# Patient Record
Sex: Male | Born: 1995 | Race: Black or African American | Hispanic: No | Marital: Single | Smoking: Current every day smoker
Health system: Southern US, Community
[De-identification: ages and names within clinical notes are randomized; demographics above are authoritative.]

---

## 2020-10-20 ENCOUNTER — Encounter (HOSPITAL_COMMUNITY): Payer: Self-pay

## 2020-10-20 ENCOUNTER — Other Ambulatory Visit: Payer: Self-pay

## 2020-10-20 ENCOUNTER — Emergency Department (HOSPITAL_COMMUNITY): Payer: Self-pay

## 2020-10-20 ENCOUNTER — Emergency Department (HOSPITAL_COMMUNITY)
Admission: EM | Admit: 2020-10-20 | Discharge: 2020-10-20 | Disposition: A | Discharge status: Home / Self Care | Payer: Self-pay | Attending: Emergency Medicine | Admitting: Emergency Medicine

## 2020-10-20 DIAGNOSIS — Z23 Encounter for immunization: Secondary | ICD-10-CM | POA: Insufficient documentation

## 2020-10-20 DIAGNOSIS — S51811A Laceration without foreign body of right forearm, initial encounter: Secondary | ICD-10-CM | POA: Insufficient documentation

## 2020-10-20 DIAGNOSIS — F17210 Nicotine dependence, cigarettes, uncomplicated: Secondary | ICD-10-CM | POA: Insufficient documentation

## 2020-10-20 DIAGNOSIS — S41111A Laceration without foreign body of right upper arm, initial encounter: Secondary | ICD-10-CM

## 2020-10-20 MED ORDER — CEPHALEXIN 500 MG PO CAPS: 500.0000 mg | ORAL_CAPSULE | Freq: Three times a day (TID) | ORAL | 0 refills | Status: AC

## 2020-10-20 MED ORDER — CEPHALEXIN 500 MG PO CAPS
500.0000 mg | ORAL_CAPSULE | Freq: Once | ORAL | Status: AC
  Administered 2020-10-20: 500 mg via ORAL
  Filled 2020-10-20: qty 1

## 2020-10-20 MED ORDER — TETANUS-DIPHTH-ACELL PERTUSSIS 5-2.5-18.5 LF-MCG/0.5 IM SUSY
0.5000 mL | PREFILLED_SYRINGE | Freq: Once | INTRAMUSCULAR | Status: AC
  Administered 2020-10-20: 0.5 mL via INTRAMUSCULAR
  Filled 2020-10-20: qty 0.5

## 2020-10-20 MED ORDER — LIDOCAINE HCL (PF) 1 % IJ SOLN
5.0000 mL | Freq: Once | INTRAMUSCULAR | Status: AC
  Administered 2020-10-20: 5 mL
  Filled 2020-10-20: qty 30
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
## 2020-10-20 NOTE — Discharge Instructions (Signed)
At this time there does not appear to be the presence of an emergent medical condition, however there is always the potential for conditions to change. Please read and follow the below instructions.  Please return to the Emergency Department immediately for any new or worsening symptoms. Please be sure to follow up with your Primary Care Provider within one week regarding your visit today; please call their office to schedule an appointment even if you are feeling better for a follow-up visit. The single stitch in your right forearm will need to be removed in 7 days.  It may be removed by your primary care doctor, the hand specialist, and urgent care or here at the emergency department. Please call the hand specialist Dr. Amanda Pea on your discharge paperwork for follow-up appointment for reevaluation and treatment of your arm laceration. Please take your antibiotic Keflex as prescribed until complete to help with your symptoms.  Please drink enough water to avoid dehydration and get plenty of rest. Please gently rinse your wound with clean soapy water twice a day and keep covered with sterile bandages.  You may apply a small amount of antibiotic ointment on the area for the next few days.  Stop pouring hydrogen peroxide on your wound.  Go to the nearest Emergency Department immediately if: You have fever or chills You have very bad swelling around the wound. Your pain suddenly gets worse and is very bad. You notice painful lumps near the wound or anywhere on your body. You have a red streak going away from your wound. The wound is on your hand or foot, and: You cannot move a finger or toe. Your fingers or toes look pale or bluish. You have any new/concerning or worsening of symptoms.   Please read the additional information packets attached to your discharge summary.  Do not take your medicine if  develop an itchy rash, swelling in your mouth or lips, or difficulty breathing; call 911 and seek  immediate emergency medical attention if this occurs.  You may review your lab tests and imaging results in their entirety on your MyChart account.  Please discuss all results of fully with your primary care provider and other specialist at your follow-up visit.  Note: Portions of this text may have been transcribed using voice recognition software. Every effort was made to ensure accuracy; however, inadvertent computerized transcription errors may still be present.
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
## 2022-09-19 IMAGING — DX DG FOREARM 2V*R*
2 series · 2 of 2 positions shown · non-contrast
Comparison: None.

CLINICAL DATA: Laceration.  Assess for foreign object.

EXAM:
RIGHT FOREARM - 2 VIEW

[forearm ap]
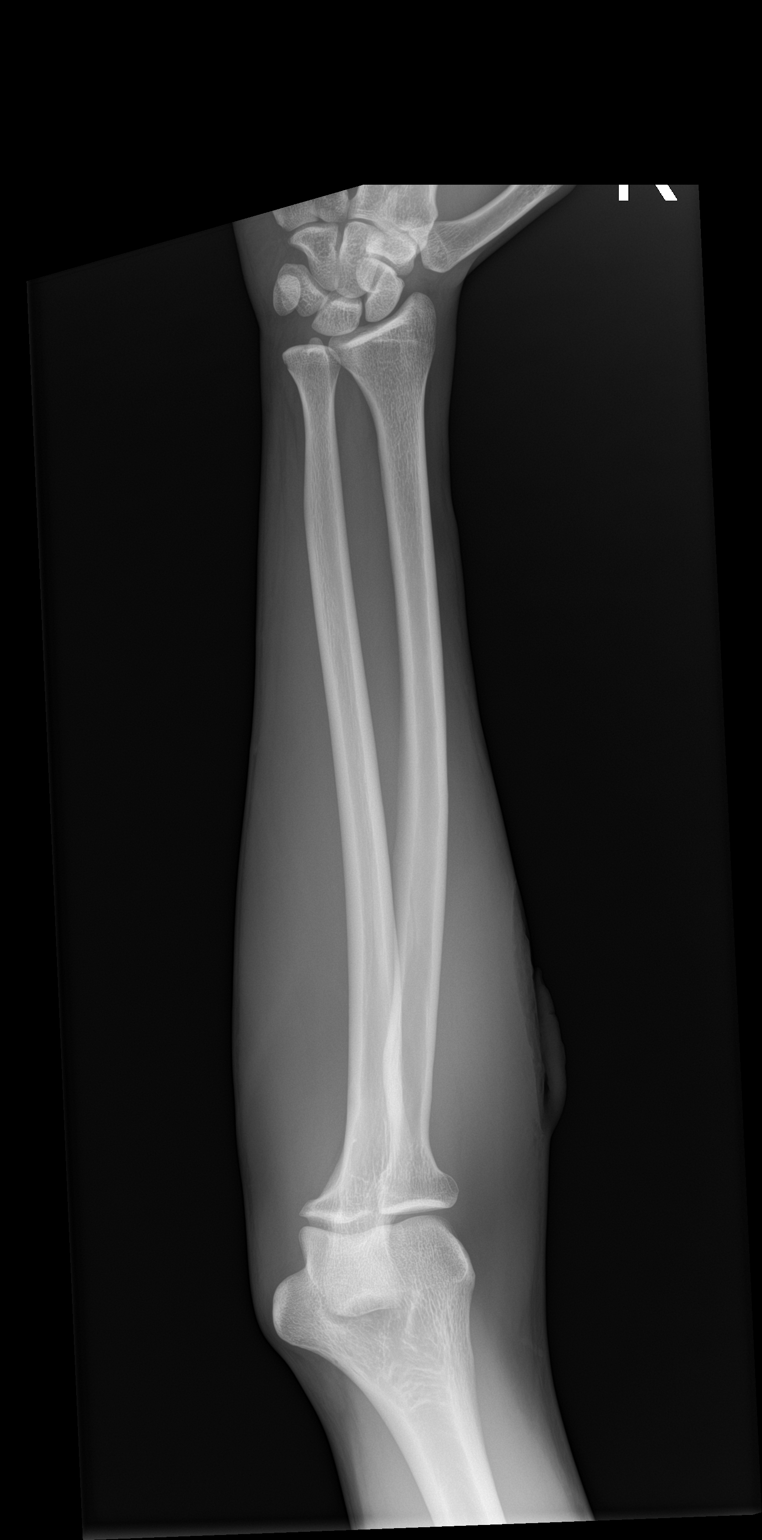

[forearm lat]
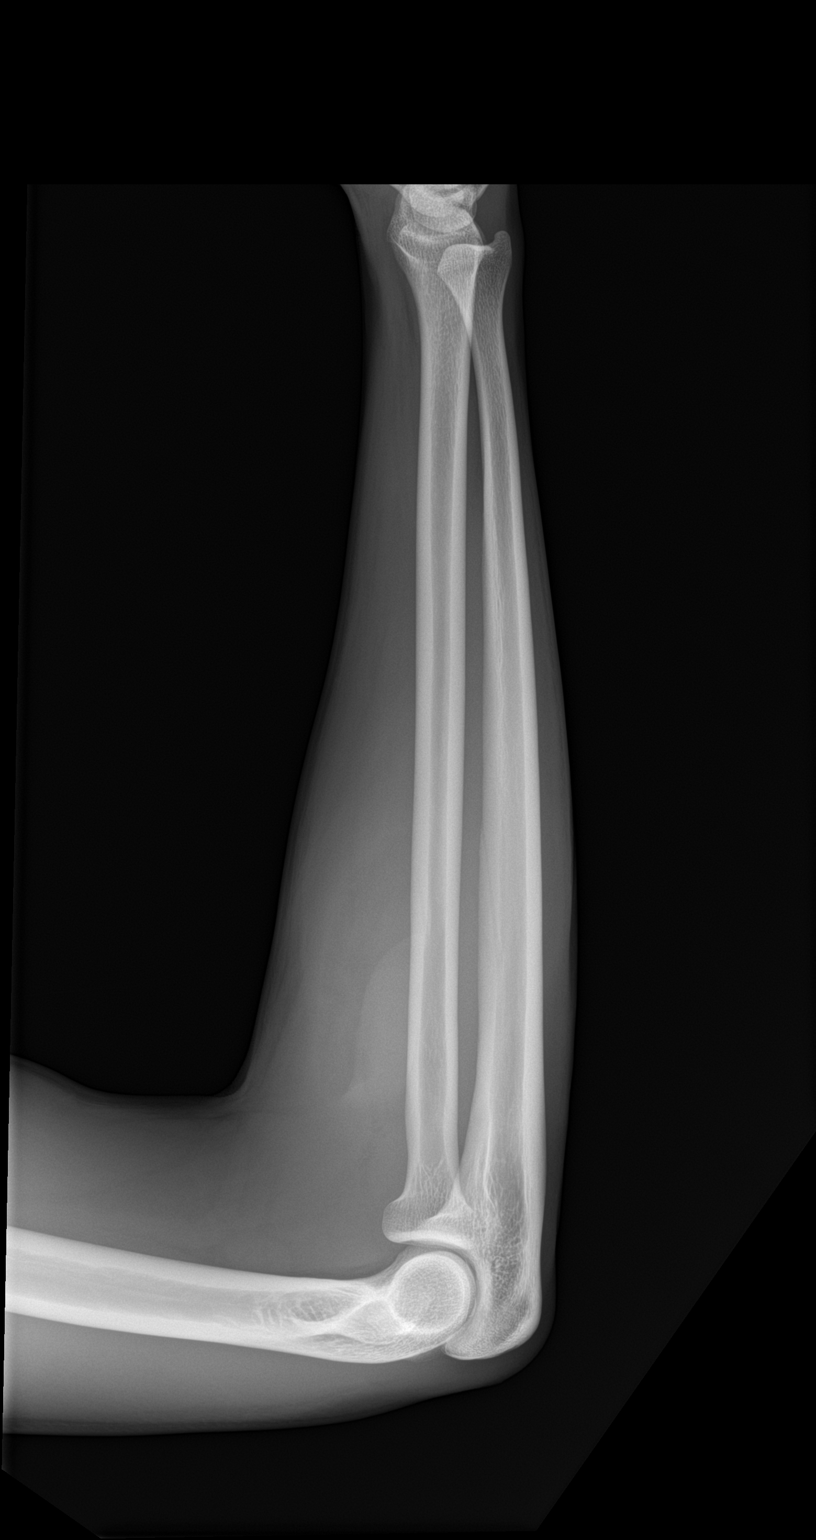

[2 of 2 positions shown; findings below may reference images not displayed]

FINDINGS: Soft tissue deformity evident. No sign of radiopaque foreign object
or fracture.
IMPRESSION: Soft tissue deformity. No sign of radiopaque foreign object or
fracture.
